# Patient Record
Sex: Female | Born: 1990 | Race: Black or African American | Hispanic: No | Marital: Single | Smoking: Never smoker
Health system: Southern US, Community
[De-identification: ages and names within clinical notes are randomized; demographics above are authoritative.]

---

## 2011-12-27 ENCOUNTER — Emergency Department (HOSPITAL_COMMUNITY)
Admission: EM | Admit: 2011-12-27 | Discharge: 2011-12-27 | Disposition: A | Discharge status: Home / Self Care | Payer: Self-pay | Attending: Emergency Medicine | Admitting: Emergency Medicine

## 2011-12-27 ENCOUNTER — Emergency Department (HOSPITAL_COMMUNITY): Payer: Self-pay

## 2011-12-27 ENCOUNTER — Encounter (HOSPITAL_COMMUNITY): Payer: Self-pay | Admitting: *Deleted

## 2011-12-27 DIAGNOSIS — N926 Irregular menstruation, unspecified: Secondary | ICD-10-CM | POA: Insufficient documentation

## 2011-12-27 DIAGNOSIS — R10819 Abdominal tenderness, unspecified site: Secondary | ICD-10-CM | POA: Insufficient documentation

## 2011-12-27 DIAGNOSIS — R102 Pelvic and perineal pain: Secondary | ICD-10-CM

## 2011-12-27 DIAGNOSIS — N939 Abnormal uterine and vaginal bleeding, unspecified: Secondary | ICD-10-CM | POA: Insufficient documentation

## 2011-12-27 DIAGNOSIS — R109 Unspecified abdominal pain: Secondary | ICD-10-CM | POA: Insufficient documentation

## 2011-12-27 LAB — CBC
HCT: 38.5 % (ref 36.0–46.0)
MCH: 24.9 pg — ABNORMAL LOW (ref 26.0–34.0)
MCV: 79.2 fL (ref 78.0–100.0)
Platelets: 270 10*3/uL (ref 150–400)
RBC: 4.86 MIL/uL (ref 3.87–5.11)

## 2011-12-27 LAB — POCT I-STAT, CHEM 8
BUN: 7 mg/dL (ref 6–23)
Calcium, Ion: 1.25 mmol/L (ref 1.12–1.32)
Chloride: 105 mEq/L (ref 96–112)
Creatinine, Ser: 0.6 mg/dL (ref 0.50–1.10)
Glucose, Bld: 89 mg/dL (ref 70–99)
HCT: 41 % (ref 36.0–46.0)
Hemoglobin: 13.9 g/dL (ref 12.0–15.0)
Potassium: 4.1 meq/L (ref 3.5–5.1)
Sodium: 142 meq/L (ref 135–145)
TCO2: 25 mmol/L (ref 0–100)

## 2011-12-27 LAB — URINALYSIS, ROUTINE W REFLEX MICROSCOPIC
Bilirubin Urine: NEGATIVE
Glucose, UA: NEGATIVE mg/dL
Ketones, ur: NEGATIVE mg/dL
Leukocytes, UA: NEGATIVE
Nitrite: NEGATIVE
Protein, ur: NEGATIVE mg/dL
Specific Gravity, Urine: 1.032 — ABNORMAL HIGH (ref 1.005–1.030)
Urobilinogen, UA: 1 mg/dL (ref 0.0–1.0)
pH: 5 (ref 5.0–8.0)

## 2011-12-27 LAB — WET PREP, GENITAL
Trich, Wet Prep: NONE SEEN
Yeast Wet Prep HPF POC: NONE SEEN

## 2011-12-27 LAB — URINE MICROSCOPIC-ADD ON

## 2011-12-27 LAB — POCT PREGNANCY, URINE: Preg Test, Ur: NEGATIVE

## 2011-12-27 MED ORDER — METRONIDAZOLE 500 MG PO TABS: 500.0000 mg | ORAL_TABLET | Freq: Two times a day (BID) | ORAL | Status: AC

## 2011-12-27 MED ORDER — IBUPROFEN 800 MG PO TABS: 800.0000 mg | ORAL_TABLET | Freq: Three times a day (TID) | ORAL | Status: AC | PRN

## 2011-12-27 NOTE — ED Provider Notes (Signed)
History     CSN: [id number]  Arrival date & time 12/27/11  1252   First MD Initiated Contact with Patient 12/27/11 1603      Chief Complaint  Patient presents with  . Abdominal Pain    (Consider location/radiation/quality/duration/timing/severity/associated sxs/prior treatment) Patient is a 21 y.o. female presenting with abdominal pain. The history is provided by the patient.  Abdominal Pain The primary symptoms of the illness include abdominal pain and nausea. The primary symptoms of the illness do not include fever, shortness of breath, vomiting, diarrhea or dysuria. The current episode started more than 2 days ago (around a week ago ). The onset of the illness was gradual ("comes and goes"). The problem has not changed since onset. The abdominal pain is located in the suprapubic region. The abdominal pain does not radiate. The severity of the abdominal pain is 7/10. The abdominal pain is relieved by nothing. Exacerbated by: none.  The patient states that she believes she is currently not pregnant (LMP ended 3/21, yesterday began menstration again ). The patient has not had a change in bowel habit. Risk factors: Pt is sexually active, butdoes not always use protection) OBGYN seen in august with no abnormal findings  Symptoms associated with the illness do not include chills, anorexia, diaphoresis, heartburn, constipation, urgency, hematuria, frequency or back pain.    History reviewed. No pertinent past medical history.  History reviewed. No pertinent past surgical history.  No family history on file.  History  Substance Use Topics  . Smoking status: Never Smoker   . Smokeless tobacco: Not on file  . Alcohol Use: No    OB History    Grav Para Term Preterm Abortions TAB SAB Ect Mult Living                  Review of Systems  Constitutional: Negative for fever, chills, diaphoresis and appetite change.  HENT: Negative for neck stiffness and dental problem.   Eyes: Negative  for visual disturbance.  Respiratory: Negative for cough, chest tightness, shortness of breath and wheezing.   Cardiovascular: Negative for chest pain.  Gastrointestinal: Positive for nausea and abdominal pain. Negative for heartburn, vomiting, diarrhea, constipation, blood in stool, abdominal distention, anal bleeding, rectal pain and anorexia.  Genitourinary: Negative for dysuria, urgency, frequency, hematuria and flank pain.  Musculoskeletal: Negative for myalgias, back pain and arthralgias.  Skin: Negative for rash.  Neurological: Negative for dizziness, syncope, speech difficulty, numbness and headaches.  Hematological: Does not bruise/bleed easily.  All other systems reviewed and are negative.    Allergies  Review of patient's allergies indicates no known allergies.  Home Medications  No current outpatient prescriptions on file.  BP 102/71  Pulse 78  Temp(Src) 98.4 F (36.9 C) (Oral)  Resp 16  SpO2 100%  LMP 12/27/2011  Physical Exam  Constitutional: She is oriented to person, place, and time. She appears well-developed and well-nourished. No distress.  HENT:  Head: Normocephalic and atraumatic.  Mouth/Throat: Oropharynx is clear and moist. No oropharyngeal exudate.  Eyes: Conjunctivae and EOM are normal. Pupils are equal, round, and reactive to light. No scleral icterus.  Neck: Normal range of motion. Neck supple. No tracheal deviation present. No thyromegaly present.  Cardiovascular: Normal rate, regular rhythm, normal heart sounds and intact distal pulses.   Pulmonary/Chest: Effort normal and breath sounds normal. No stridor. No respiratory distress. She has no wheezes.  Abdominal: Soft. There is tenderness in the suprapubic area and left lower quadrant. There is no CVA  tenderness.    Musculoskeletal: Normal range of motion. She exhibits no edema and no tenderness.  Neurological: She is alert and oriented to person, place, and time. Coordination normal.  Skin: Skin  is warm and dry. No rash noted. She is not diaphoretic. No erythema. No pallor.  Psychiatric: She has a normal mood and affect. Her behavior is normal.    ED Course  Procedures (including critical care time)  Labs Reviewed  URINALYSIS, ROUTINE W REFLEX MICROSCOPIC - Abnormal; Notable for the following:    Specific Gravity, Urine 1.032 (*)    Hgb urine dipstick TRACE (*)    All other components within normal limits  URINE MICROSCOPIC-ADD ON - Abnormal; Notable for the following:    Squamous Epithelial / LPF MANY (*)    All other components within normal limits  WET PREP, GENITAL - Abnormal; Notable for the following:    Clue Cells Wet Prep HPF POC RARE (*)    WBC, Wet Prep HPF POC RARE (*)    All other components within normal limits  CBC - Abnormal; Notable for the following:    MCH 24.9 (*)    All other components within normal limits  POCT PREGNANCY, URINE  POCT I-STAT, CHEM 8  GC/CHLAMYDIA PROBE AMP, GENITAL   US Transvaginal Non-ob  12/27/2011  *RADIOLOGY REPORT*  Clinical Data: Pelvic pain.  TRANSABDOMINAL AND TRANSVAGINAL ULTRASOUND OF PELVIS Technique:  Both transabdominal and transvaginal ultrasound examinations of the pelvis were performed. Transabdominal technique was performed for global imaging of the pelvis including uterus, ovaries, adnexal regions, and pelvic cul-de-sac.  Comparison: None.   It was necessary to proceed with endovaginal exam following the transabdominal exam to visualize the endometrium and ovaries.  Findings:  Uterus: Measures 8.5 x 3.4 x 4.9 cm.  No myometrial abnormalities are identified.  A small amount of fluid is noted in the endocervical canal.  Endometrium: Normal in thickness measuring a maximum of 5.5 mm.  Right ovary:  Measures 4.1 x 2.0 x 2.9 cm.  A small cyst is noted. This measures 2.3 x 1.2 x 1.0 cm.  Left ovary: Measures 3.1 x 2.0 x 3.9 cm.  No cysts or masses.  Other findings: Trace amount of free pelvic fluid.  Patent blood flow to both  ovaries.  IMPRESSION: Normal study. No evidence of pelvic mass or other significant abnormality.  Original Report Authenticated By: P. Loralie Champagne, M.D.   US Pelvis Complete  12/27/2011  *RADIOLOGY REPORT*  Clinical Data: Pelvic pain.  TRANSABDOMINAL AND TRANSVAGINAL ULTRASOUND OF PELVIS Technique:  Both transabdominal and transvaginal ultrasound examinations of the pelvis were performed. Transabdominal technique was performed for global imaging of the pelvis including uterus, ovaries, adnexal regions, and pelvic cul-de-sac.  Comparison: None.   It was necessary to proceed with endovaginal exam following the transabdominal exam to visualize the endometrium and ovaries.  Findings:  Uterus: Measures 8.5 x 3.4 x 4.9 cm.  No myometrial abnormalities are identified.  A small amount of fluid is noted in the endocervical canal.  Endometrium: Normal in thickness measuring a maximum of 5.5 mm.  Right ovary:  Measures 4.1 x 2.0 x 2.9 cm.  A small cyst is noted. This measures 2.3 x 1.2 x 1.0 cm.  Left ovary: Measures 3.1 x 2.0 x 3.9 cm.  No cysts or masses.  Other findings: Trace amount of free pelvic fluid.  Patent blood flow to both ovaries.  IMPRESSION: Normal study. No evidence of pelvic mass or other significant abnormality.  Original  Report Authenticated By: P. Loralie Champagne, M.D.     No diagnosis found.    MDM  Patient being treated for bacterial vaginosis due to findings of clue cells on wet prep.  Patient is advised to followup with OB/GYN, women's health clinic in regards to today's symptoms.  Patient is stable, pain-free, and in no acute distress prior to discharge.  Patient is agreeable with above plan.  Patient will be sent home with pain medications.        Jaci Carrel, [place] 12/27/11 1815

## 2011-12-27 NOTE — ED Notes (Signed)
Pt state she is having lower abdominal pain. Pt states she had a cycle 10 days ago and has started to have vaginal bleeding again. Pt denies any urinary symptoms

## 2011-12-27 NOTE — ED Notes (Signed)
Reviewed d/c instructions and Rx for Flagyl and Motrin.

## 2011-12-27 NOTE — Discharge Instructions (Signed)
Follow up with [facility] for further evaluation of you pain/abnormal menstural bleeding.  Use Motrin as prescribed for pain and be sure to take it with a full meal.   .SEEK IMMEDIATE MEDICAL CARE IF:  The pain does not go away.  You have a fever >101 that persists You keep throwing up (vomiting) or cannot drink liquids.  The pain becomes localized (Pain in the right side could possibly be appendicitis. In an adult, pain in the left lower portion of the abdomen could be colitis or diverticulitis). You pass bloody or black tarry stools.  You have shaking chills.  There is blood in your vomit or you see blood in your bowel movements.  Your bowel movements stop (become blocked) or you cannot pass gas.  You have bloody, frequent, or painful urination.  You have yellow discoloration in the skin or whites of the eyes.  Your stomach becomes bloated or bigger.  You have dizziness or fainting.  You have chest or back pain.

## 2011-12-29 LAB — GC/CHLAMYDIA PROBE AMP, GENITAL: GC Probe Amp, Genital: NEGATIVE

## 2011-12-30 NOTE — ED Provider Notes (Signed)
Medical screening examination/treatment/procedure(s) were performed by non-physician practitioner and as supervising physician I was immediately available for consultation/collaboration.   Gavin Pound. Oletta Lamas, MD 12/30/11 2028

## 2013-05-09 ENCOUNTER — Emergency Department (HOSPITAL_COMMUNITY)
Admission: EM | Admit: 2013-05-09 | Discharge: 2013-05-09 | Disposition: A | Discharge status: Home / Self Care | Payer: Self-pay | Attending: Emergency Medicine | Admitting: Emergency Medicine

## 2013-05-09 ENCOUNTER — Encounter (HOSPITAL_COMMUNITY): Payer: Self-pay | Admitting: Emergency Medicine

## 2013-05-09 DIAGNOSIS — R51 Headache: Secondary | ICD-10-CM | POA: Insufficient documentation

## 2013-05-09 DIAGNOSIS — H9190 Unspecified hearing loss, unspecified ear: Secondary | ICD-10-CM | POA: Insufficient documentation

## 2013-05-09 DIAGNOSIS — J029 Acute pharyngitis, unspecified: Secondary | ICD-10-CM | POA: Insufficient documentation

## 2013-05-09 DIAGNOSIS — H9210 Otorrhea, unspecified ear: Secondary | ICD-10-CM | POA: Insufficient documentation

## 2013-05-09 DIAGNOSIS — J3489 Other specified disorders of nose and nasal sinuses: Secondary | ICD-10-CM | POA: Insufficient documentation

## 2013-05-09 DIAGNOSIS — H6120 Impacted cerumen, unspecified ear: Secondary | ICD-10-CM | POA: Insufficient documentation

## 2013-05-09 DIAGNOSIS — H6122 Impacted cerumen, left ear: Secondary | ICD-10-CM

## 2013-05-09 MED ORDER — CARBAMIDE PEROXIDE 6.5 % OT SOLN
5.0000 [drp] | Freq: Once | OTIC | Status: DC
  Filled 2013-05-09: qty 15

## 2013-05-09 NOTE — ED Notes (Signed)
Per pt, states left ear clogged since last Monday-

## 2013-05-09 NOTE — ED Provider Notes (Signed)
CSN: [id number]     Arrival date & time 05/09/13  1753 History    This chart was scribed for a non-physician practitioner, Antony Madura, PA-C, working with Ward Givens, MD by Frederik Pear, ED Scribe. This patient was seen in room WTR8/WTR8 and the patient's care was started at 1855.   First MD Initiated Contact with Patient 05/09/13 1855     Chief Complaint  Patient presents with  . Otalgia   (Consider location/radiation/quality/duration/timing/severity/associated sxs/prior Treatment) Patient is a 22 y.o. female presenting with ear pain. The history is provided by the patient. No language interpreter was used.  Otalgia Associated symptoms: ear discharge and hearing loss    HPI Comments: Nancy Cruz is a 22 y.o. female who presents to the Emergency Department complaining of worsening, constant, throbbing, non-radiating left otalgia with a scratchy sore throat, decreased hearing, and white otorrhea that began a week and a half ago without trauma or injury. She also complains of a HA that began this morning. She reports sinus congestion over the past week, but the symptom has since resolved after taking OTC sinus medication. She denies drooling, oral bleeding, fever, or inability to swallow. She treated the pain at home with ibuprofen, which provided temporary relief.   Dr. Allena Katz is PCP.   History reviewed. No pertinent past medical history. History reviewed. No pertinent past surgical history. No family history on file. History  Substance Use Topics  . Smoking status: Never Smoker   . Smokeless tobacco: Not on file  . Alcohol Use: No   OB History   Grav Para Term Preterm Abortions TAB SAB Ect Mult Living                 Review of Systems  HENT: Positive for hearing loss, ear pain, sinus pressure and ear discharge.   All other systems reviewed and are negative.   Allergies  Review of patient's allergies indicates no known allergies.  Home Medications   Current Outpatient  Rx  Name  Route  Sig  Dispense  Refill  . diphenhydrAMINE (BENADRYL) 25 MG tablet   Oral   Take 25 mg by mouth every 6 (six) hours as needed for itching.         Marland Kitchen ibuprofen (ADVIL,MOTRIN) 200 MG tablet   Oral   Take 200 mg by mouth every 6 (six) hours as needed for pain.         . Pseudoeph-Doxylamine-DM-APAP (NYQUIL MULTI-SYMPTOM PO)   Oral   Take 1 capsule by mouth at bedtime as needed.          BP 101/66  Pulse 73  Temp(Src) 99.2 F (37.3 C) (Oral)  Resp 18  SpO2 100%  LMP 04/24/2013 Physical Exam  Nursing note and vitals reviewed. Constitutional: She is oriented to person, place, and time. She appears well-developed and well-nourished. No distress.  HENT:  Head: Normocephalic and atraumatic.  Right Ear: Tympanic membrane normal.  Left Ear: Tympanic membrane normal.  Mouth/Throat: Uvula is midline and oropharynx is clear and moist. No oropharyngeal exudate, posterior oropharyngeal edema, posterior oropharyngeal erythema or tonsillar abscesses.   TMs are occluded with cerumen. She can hear to 10 inches on the left and 2.5 feet on right.  Post-cerumen impaction the pt's TMs are clear.  Eyes: EOM are normal.  Neck: Neck supple. No tracheal deviation present.  Cardiovascular: Normal rate.   Pulmonary/Chest: Effort normal. No respiratory distress.  Musculoskeletal: Normal range of motion.  Lymphadenopathy:    She has no  cervical adenopathy.  Neurological: She is alert and oriented to person, place, and time.  Skin: Skin is warm and dry.  Psychiatric: She has a normal mood and affect. Her behavior is normal.    ED Course   Procedures (including critical care time)  DIAGNOSTIC STUDIES: Oxygen Saturation is 100% on room air, normal by my interpretation.    COORDINATION OF CARE:  19:15- Discussed planned course of treatment with the patient, including Debrox and a cerumen removal, who is agreeable at this time.  20:26- Post cerumen impaction, the pt's left TM  is clear after removing a nugget of cerumen.  Labs Reviewed - No data to display No results found.  1. Cerumen impaction, left    MDM  Uncomplicated cerumen impaction - Cerumen removed with warm water flush in ED without any immediate complications. Hearing restored and equal b/l after disimpaction. Patient appropriate for d/c with PCP follow up as needed. Indications for return discussed and patient agreeable to d/c plan.  I personally performed the services described in this documentation, which was scribed in my presence. The recorded information has been reviewed and is accurate.     Antony Madura, PA-C 05/17/13 1417

## 2013-05-18 NOTE — ED Provider Notes (Signed)
Medical screening examination/treatment/procedure(s) were performed by non-physician practitioner and as supervising physician I was immediately available for consultation/collaboration. Wisdom Seybold, MD, FACEP   Wisdom Rickey L Afifa Truax, MD 05/18/13 0708 

## 2018-06-12 ENCOUNTER — Encounter (HOSPITAL_BASED_OUTPATIENT_CLINIC_OR_DEPARTMENT_OTHER): Payer: Self-pay

## 2018-06-12 ENCOUNTER — Emergency Department (HOSPITAL_BASED_OUTPATIENT_CLINIC_OR_DEPARTMENT_OTHER)
Admission: EM | Admit: 2018-06-12 | Discharge: 2018-06-12 | Disposition: A | Discharge status: Home / Self Care | Payer: 59 | Attending: Emergency Medicine | Admitting: Emergency Medicine

## 2018-06-12 ENCOUNTER — Emergency Department (HOSPITAL_BASED_OUTPATIENT_CLINIC_OR_DEPARTMENT_OTHER): Payer: 59

## 2018-06-12 DIAGNOSIS — Y92410 Unspecified street and highway as the place of occurrence of the external cause: Secondary | ICD-10-CM | POA: Diagnosis not present

## 2018-06-12 DIAGNOSIS — S161XXA Strain of muscle, fascia and tendon at neck level, initial encounter: Secondary | ICD-10-CM | POA: Insufficient documentation

## 2018-06-12 DIAGNOSIS — S199XXA Unspecified injury of neck, initial encounter: Secondary | ICD-10-CM | POA: Diagnosis present

## 2018-06-12 DIAGNOSIS — Y9389 Activity, other specified: Secondary | ICD-10-CM | POA: Diagnosis not present

## 2018-06-12 DIAGNOSIS — Y999 Unspecified external cause status: Secondary | ICD-10-CM | POA: Insufficient documentation

## 2018-06-12 DIAGNOSIS — M542 Cervicalgia: Secondary | ICD-10-CM | POA: Diagnosis not present

## 2018-06-12 LAB — PREGNANCY, URINE: Preg Test, Ur: NEGATIVE

## 2018-06-12 MED ORDER — IBUPROFEN 800 MG PO TABS: 800.0000 mg | ORAL_TABLET | Freq: Three times a day (TID) | ORAL | 0 refills | Status: DC | PRN

## 2018-06-12 NOTE — Discharge Instructions (Signed)
Ice and heat to the areas that are sore.  She can expect to be more sore over the next 7 to 10 days.  Follow-up with a primary doctor.
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
## 2019-07-06 ENCOUNTER — Emergency Department (HOSPITAL_BASED_OUTPATIENT_CLINIC_OR_DEPARTMENT_OTHER)
Admission: EM | Admit: 2019-07-06 | Discharge: 2019-07-06 | Disposition: A | Discharge status: Home / Self Care | Payer: Managed Care, Other (non HMO) | Attending: Emergency Medicine | Admitting: Emergency Medicine

## 2019-07-06 ENCOUNTER — Encounter (HOSPITAL_BASED_OUTPATIENT_CLINIC_OR_DEPARTMENT_OTHER): Payer: Self-pay | Admitting: Emergency Medicine

## 2019-07-06 ENCOUNTER — Other Ambulatory Visit: Payer: Self-pay

## 2019-07-06 ENCOUNTER — Emergency Department (HOSPITAL_BASED_OUTPATIENT_CLINIC_OR_DEPARTMENT_OTHER): Payer: Managed Care, Other (non HMO)

## 2019-07-06 DIAGNOSIS — J069 Acute upper respiratory infection, unspecified: Secondary | ICD-10-CM | POA: Diagnosis not present

## 2019-07-06 DIAGNOSIS — R509 Fever, unspecified: Secondary | ICD-10-CM | POA: Diagnosis present

## 2019-07-06 DIAGNOSIS — U071 COVID-19: Secondary | ICD-10-CM | POA: Insufficient documentation

## 2019-07-06 LAB — CBC
HCT: 40.2 % (ref 36.0–46.0)
Hemoglobin: 12 g/dL (ref 12.0–15.0)
MCH: 24.1 pg — ABNORMAL LOW (ref 26.0–34.0)
MCHC: 29.9 g/dL — ABNORMAL LOW (ref 30.0–36.0)
MCV: 80.9 fL (ref 80.0–100.0)
Platelets: 248 10*3/uL (ref 150–400)
RBC: 4.97 MIL/uL (ref 3.87–5.11)
RDW: 13.3 % (ref 11.5–15.5)
WBC: 3.1 10*3/uL — ABNORMAL LOW (ref 4.0–10.5)
nRBC: 0 % (ref 0.0–0.2)

## 2019-07-06 LAB — TROPONIN I (HIGH SENSITIVITY)
Troponin I (High Sensitivity): <2 ng/L (ref <18)
Troponin I (High Sensitivity): <2 ng/L (ref <18)

## 2019-07-06 LAB — BASIC METABOLIC PANEL
Anion gap: 10 (ref 5–15)
BUN: 8 mg/dL (ref 6–20)
CO2: 26 mmol/L (ref 22–32)
Calcium: 8.8 mg/dL — ABNORMAL LOW (ref 8.9–10.3)
Chloride: 102 mmol/L (ref 98–111)
Creatinine, Ser: 0.71 mg/dL (ref 0.44–1.00)
GFR calc Af Amer: >60 mL/min (ref >60)
GFR calc non Af Amer: >60 mL/min (ref >60)
Glucose, Bld: 95 mg/dL (ref 70–99)
Potassium: 3.5 mmol/L (ref 3.5–5.1)
Sodium: 138 mmol/L (ref 135–145)

## 2019-07-06 LAB — PREGNANCY, URINE: Preg Test, Ur: NEGATIVE

## 2019-07-06 MED ORDER — ACETAMINOPHEN 325 MG PO TABS
650.0000 mg | ORAL_TABLET | Freq: Once | ORAL | Status: AC
  Administered 2019-07-06: 21:00:00 650 mg via ORAL
  Filled 2019-07-06: qty 2

## 2019-07-06 MED ORDER — SODIUM CHLORIDE 0.9% FLUSH
3.0000 mL | Freq: Once | INTRAVENOUS | Status: DC
  Filled 2019-07-06: qty 3

## 2019-07-06 NOTE — ED Notes (Signed)
Placed on cardiac monitor. Pt states she feels better, only complaining of headache now. IV and blood work initiated in triage.

## 2019-07-06 NOTE — ED Notes (Addendum)
Second troponin draw not needed per EDP.

## 2019-07-06 NOTE — ED Provider Notes (Signed)
[facility] EMERGENCY DEPARTMENT Provider Note   CSN: [id number] Arrival date & time: 07/06/19  1744     History   Chief Complaint Chief Complaint  Patient presents with  . Chest Pain  . Shortness of Breath  . Palpitations  . Headache  . Fever    101.2    HPI Nancy Cruz is a 28 y.o. female.     The history is provided by the patient.  Fever Max temp prior to arrival:  101 Temp source:  Oral Severity:  Mild Onset quality:  Gradual Duration:  4 days Timing:  Intermittent Progression:  Waxing and waning Chronicity:  New Relieved by:  Acetaminophen Worsened by:  Nothing Associated symptoms: no chest pain, no chills, no confusion, no congestion, no cough, no diarrhea, no dysuria, no ear pain, no headaches, no myalgias, no nausea, no rash, no rhinorrhea, no somnolence, no sore throat and no vomiting   Risk factors: sick contacts     History reviewed. No pertinent past medical history.  There are no active problems to display for this patient.   History reviewed. No pertinent surgical history.   OB History   No obstetric history on file.      Home Medications    Prior to Admission medications   Not on File    Family History No family history on file.  Social History Social History   Tobacco Use  . Smoking status: Never Smoker  . Smokeless tobacco: Never Used  Substance Use Topics  . Alcohol use: No  . Drug use: No     Allergies   Patient has no known allergies.   Review of Systems Review of Systems  Constitutional: Positive for fever. Negative for chills.  HENT: Negative for congestion, ear pain, rhinorrhea and sore throat.   Eyes: Negative for pain and visual disturbance.  Respiratory: Negative for cough and shortness of breath.   Cardiovascular: Negative for chest pain and palpitations.  Gastrointestinal: Negative for abdominal pain, diarrhea, nausea and vomiting.  Genitourinary: Negative for dysuria and hematuria.   Musculoskeletal: Negative for arthralgias, back pain and myalgias.  Skin: Negative for color change and rash.  Neurological: Negative for seizures, syncope and headaches.  Psychiatric/Behavioral: Negative for confusion.  All other systems reviewed and are negative.    Physical Exam Updated Vital Signs BP 101/76   Pulse 72   Temp 100.3 F (37.9 C) (Oral)   Resp 20   Ht 5' 2.75" (1.594 m)   Wt 63.5 kg   LMP 06/17/2019   SpO2 100%   BMI 25.00 kg/m   Physical Exam Vitals signs and nursing note reviewed.  Constitutional:      General: She is not in acute distress.    Appearance: She is well-developed. She is not ill-appearing.  HENT:     Head: Normocephalic and atraumatic.  Eyes:     Extraocular Movements: Extraocular movements intact.     Conjunctiva/sclera: Conjunctivae normal.     Pupils: Pupils are equal, round, and reactive to light.  Neck:     Musculoskeletal: Neck supple.  Cardiovascular:     Rate and Rhythm: Normal rate and regular rhythm.     Pulses:          Radial pulses are 2+ on the right side and 2+ on the left side.     Heart sounds: Normal heart sounds. No murmur.  Pulmonary:     Effort: Pulmonary effort is normal. No tachypnea, accessory muscle usage or respiratory distress.  Breath sounds: Normal breath sounds. No decreased breath sounds, wheezing, rhonchi or rales.  Abdominal:     Palpations: Abdomen is soft.     Tenderness: There is no abdominal tenderness.  Musculoskeletal: Normal range of motion.     Right lower leg: No edema.     Left lower leg: No edema.  Skin:    General: Skin is warm and dry.     Capillary Refill: Capillary refill takes less than 2 seconds.  Neurological:     General: No focal deficit present.     Mental Status: She is alert and oriented to person, place, and time.     Cranial Nerves: No cranial nerve deficit.     Motor: No weakness.  Psychiatric:        Mood and Affect: Mood normal.      ED Treatments / Results   Labs (all labs ordered are listed, but only abnormal results are displayed) Labs Reviewed  BASIC METABOLIC PANEL - Abnormal; Notable for the following components:      Result Value   Calcium 8.8 (*)    All other components within normal limits  CBC - Abnormal; Notable for the following components:   WBC 3.1 (*)    MCH 24.1 (*)    MCHC 29.9 (*)    All other components within normal limits  SARS CORONAVIRUS 2 (TAT 6-24 HRS)  PREGNANCY, URINE  TROPONIN I (HIGH SENSITIVITY)  TROPONIN I (HIGH SENSITIVITY)    EKG EKG Interpretation  Date/Time:  Saturday July 06 2019 18:01:37 EDT Ventricular Rate:  98 PR Interval:  164 QRS Duration: 76 QT Interval:  346 QTC Calculation: 441 R Axis:   82 Text Interpretation:  Normal sinus rhythm Nonspecific T wave abnormality Abnormal ECG Confirmed by Lennice Sites [phone]) on 07/06/2019 8:33:16 PM   Radiology Dg Chest 2 View  Result Date: 07/06/2019 CLINICAL DATA:  Congestion, fever 101, body aches x 4 days. Non smoker. EXAM: CHEST - 2 VIEW COMPARISON:  None. FINDINGS: The heart size and mediastinal contours are within normal limits. The lungs are clear. No pneumothorax or pleural effusion. The visualized skeletal structures are unremarkable. IMPRESSION: No evidence of active disease in the chest. Electronically Signed   By: Audie Pinto M.D.   On: 07/06/2019 18:21    Procedures Procedures (including critical care time)  Medications Ordered in ED Medications  sodium chloride flush (NS) 0.9 % injection 3 mL (3 mLs Intravenous Not Given 07/06/19 1956)  acetaminophen (TYLENOL) tablet 650 mg (650 mg Oral Given 07/06/19 2038)     Initial Impression / Assessment and Plan / ED Course  I have reviewed the triage vital signs and the nursing notes.  Pertinent labs & imaging results that were available during my care of the patient were reviewed by me and considered in my medical decision making (see chart for details).     Nancy Cruz is a 28 year old female with no significant medical problems who presents to the ED with fever, cough.  Patient with low-grade fever upon arrival but otherwise normal vitals.  Patient works in the healthcare setting with patients with coronavirus.  Has body aches, fever, congestion for the last 4 days.  No signs to suggest meningitis.  Clear breath sounds on exam.  No signs of respiratory distress.  X-ray of the chest showed no signs of pneumonia, no pneumothorax, no pleural effusion.  Patient has a white count of 3.1.  She had 2 troponins drawn that were normal.  Doubt cardiac  process.  EKG shows sinus rhythm.  No suspicion for PE.  No other significant anemia, electrolyte abnormality.  Patient does not have any urinary symptoms.  Overall suspect the patient has coronavirus or other viral process.  Coronavirus was sent for.  Given instructions for self-isolation at home.  Told return to the ED symptoms worsen.  Discharged in good condition.  This chart was dictated using voice recognition software.  Despite best efforts to proofread,  errors can occur which can change the documentation meaning.    Nancy Cruz was evaluated in Emergency Department on 07/06/2019 for the symptoms described in the history of present illness. She was evaluated in the context of the global COVID-19 pandemic, which necessitated consideration that the patient might be at risk for infection with the SARS-CoV-2 virus that causes COVID-19. Institutional protocols and algorithms that pertain to the evaluation of patients at risk for COVID-19 are in a state of rapid change based on information released by regulatory bodies including the CDC and federal and state organizations. These policies and algorithms were followed during the patient's care in the ED.   Final Clinical Impressions(s) / ED Diagnoses   Final diagnoses:  Upper respiratory tract infection, unspecified type    ED Discharge Orders    None        Virgina Norfolk, DO 07/06/19 2236

## 2019-07-06 NOTE — ED Triage Notes (Signed)
Pt c/o left sided chest pain when she coughs, but has intermittent chest pain in general. Pt reports fever, Nausea, shortness of breath, heart palpitations, and headache x 1 week

## 2019-07-07 ENCOUNTER — Telehealth (HOSPITAL_BASED_OUTPATIENT_CLINIC_OR_DEPARTMENT_OTHER): Payer: Self-pay | Admitting: *Deleted

## 2019-07-07 LAB — SARS CORONAVIRUS 2 (TAT 6-24 HRS): SARS Coronavirus 2: POSITIVE — AB
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
## 2020-12-31 ENCOUNTER — Other Ambulatory Visit: Payer: Self-pay

## 2020-12-31 ENCOUNTER — Emergency Department (HOSPITAL_BASED_OUTPATIENT_CLINIC_OR_DEPARTMENT_OTHER)
Admission: EM | Admit: 2020-12-31 | Discharge: 2020-12-31 | Disposition: A | Discharge status: Home / Self Care | Payer: No Typology Code available for payment source | Attending: Emergency Medicine | Admitting: Emergency Medicine

## 2020-12-31 ENCOUNTER — Encounter (HOSPITAL_BASED_OUTPATIENT_CLINIC_OR_DEPARTMENT_OTHER): Payer: Self-pay

## 2020-12-31 DIAGNOSIS — E871 Hypo-osmolality and hyponatremia: Secondary | ICD-10-CM | POA: Insufficient documentation

## 2020-12-31 DIAGNOSIS — R110 Nausea: Secondary | ICD-10-CM | POA: Insufficient documentation

## 2020-12-31 DIAGNOSIS — R000 Tachycardia, unspecified: Secondary | ICD-10-CM | POA: Diagnosis not present

## 2020-12-31 DIAGNOSIS — Z20822 Contact with and (suspected) exposure to covid-19: Secondary | ICD-10-CM | POA: Insufficient documentation

## 2020-12-31 DIAGNOSIS — R519 Headache, unspecified: Secondary | ICD-10-CM | POA: Insufficient documentation

## 2020-12-31 DIAGNOSIS — M542 Cervicalgia: Secondary | ICD-10-CM | POA: Diagnosis not present

## 2020-12-31 DIAGNOSIS — R070 Pain in throat: Secondary | ICD-10-CM | POA: Diagnosis present

## 2020-12-31 DIAGNOSIS — J029 Acute pharyngitis, unspecified: Secondary | ICD-10-CM | POA: Insufficient documentation

## 2020-12-31 DIAGNOSIS — E876 Hypokalemia: Secondary | ICD-10-CM | POA: Diagnosis not present

## 2020-12-31 LAB — COMPREHENSIVE METABOLIC PANEL
ALT: 29 U/L (ref 0–44)
AST: 26 U/L (ref 15–41)
Albumin: 3.9 g/dL (ref 3.5–5.0)
Alkaline Phosphatase: 58 U/L (ref 38–126)
Anion gap: 11 (ref 5–15)
BUN: 8 mg/dL (ref 6–20)
CO2: 21 mmol/L — ABNORMAL LOW (ref 22–32)
Calcium: 8.6 mg/dL — ABNORMAL LOW (ref 8.9–10.3)
Chloride: 97 mmol/L — ABNORMAL LOW (ref 98–111)
Creatinine, Ser: 0.64 mg/dL (ref 0.44–1.00)
GFR, Estimated: >60 mL/min (ref >60)
Glucose, Bld: 97 mg/dL (ref 70–99)
Potassium: 3 mmol/L — ABNORMAL LOW (ref 3.5–5.1)
Sodium: 129 mmol/L — ABNORMAL LOW (ref 135–145)
Total Bilirubin: 1.8 mg/dL — ABNORMAL HIGH (ref 0.3–1.2)
Total Protein: 7.4 g/dL (ref 6.5–8.1)

## 2020-12-31 LAB — CBC WITH DIFFERENTIAL/PLATELET
Abs Immature Granulocytes: 0.02 10*3/uL (ref 0.00–0.07)
Basophils Absolute: 0 10*3/uL (ref 0.0–0.1)
Basophils Relative: 0 %
Eosinophils Absolute: 0 10*3/uL (ref 0.0–0.5)
Eosinophils Relative: 0 %
HCT: 37 % (ref 36.0–46.0)
Hemoglobin: 12 g/dL (ref 12.0–15.0)
Immature Granulocytes: 0 %
Lymphocytes Relative: 17 %
Lymphs Abs: 1.4 10*3/uL (ref 0.7–4.0)
MCH: 25.3 pg — ABNORMAL LOW (ref 26.0–34.0)
MCHC: 32.4 g/dL (ref 30.0–36.0)
MCV: 77.9 fL — ABNORMAL LOW (ref 80.0–100.0)
Monocytes Absolute: 1 10*3/uL (ref 0.1–1.0)
Monocytes Relative: 13 %
Neutro Abs: 5.5 10*3/uL (ref 1.7–7.7)
Neutrophils Relative %: 70 %
Platelets: 222 10*3/uL (ref 150–400)
RBC: 4.75 MIL/uL (ref 3.87–5.11)
RDW: 13.5 % (ref 11.5–15.5)
WBC: 8 10*3/uL (ref 4.0–10.5)
nRBC: 0 % (ref 0.0–0.2)

## 2020-12-31 LAB — GROUP A STREP BY PCR: Group A Strep by PCR: NOT DETECTED

## 2020-12-31 LAB — RESP PANEL BY RT-PCR (FLU A&B, COVID) ARPGX2
Influenza A by PCR: NEGATIVE
Influenza B by PCR: NEGATIVE
SARS Coronavirus 2 by RT PCR: NEGATIVE

## 2020-12-31 LAB — PREGNANCY, URINE: Preg Test, Ur: NEGATIVE

## 2020-12-31 LAB — MONONUCLEOSIS SCREEN: Mono Screen: NEGATIVE

## 2020-12-31 MED ORDER — LIDOCAINE VISCOUS HCL 2 % MT SOLN: 15.0000 mL | OROMUCOSAL | 0 refills | Status: AC | PRN

## 2020-12-31 MED ORDER — IBUPROFEN 800 MG PO TABS
800.0000 mg | ORAL_TABLET | Freq: Once | ORAL | Status: AC
  Administered 2020-12-31: 800 mg via ORAL
  Filled 2020-12-31: qty 1

## 2020-12-31 MED ORDER — AMOXICILLIN 500 MG PO CAPS: 500.0000 mg | ORAL_CAPSULE | Freq: Two times a day (BID) | ORAL | 0 refills | Status: AC

## 2020-12-31 MED ORDER — AMOXICILLIN 500 MG PO CAPS
500.0000 mg | ORAL_CAPSULE | Freq: Once | ORAL | Status: AC
  Administered 2020-12-31: 500 mg via ORAL
  Filled 2020-12-31: qty 1

## 2020-12-31 MED ORDER — POTASSIUM CHLORIDE CRYS ER 20 MEQ PO TBCR: 20.0000 meq | EXTENDED_RELEASE_TABLET | Freq: Two times a day (BID) | ORAL | 0 refills | Status: AC

## 2020-12-31 MED ORDER — DEXAMETHASONE SODIUM PHOSPHATE 10 MG/ML IJ SOLN
10.0000 mg | Freq: Once | INTRAMUSCULAR | Status: AC
  Administered 2020-12-31: 10 mg via INTRAMUSCULAR
  Filled 2020-12-31: qty 1

## 2020-12-31 NOTE — ED Provider Notes (Signed)
MSE was initiated and I personally evaluated the patient and placed orders (if any) at  4:38 PM on December 31, 2020.  The patient appears stable so that the remainder of the MSE may be completed by another provider.   Patient placed in Quick Look pathway, seen and evaluated   Chief Complaint: fever, sore throat, nausea  HPI:   30 y.o. F who presents for evaluation of 2 days of subjective fever, chills as well as sore throat, left-sided neck pain, body aches.  She has been able to tolerate secretions in her saliva but does report worsening pain with doing so.  She has had nausea but no vomiting.  ROS: Nausea, fever, sore throat (one)  Physical Exam:   Gen: No distress  Neuro: Awake and Alert  Skin: Warm    Focused Exam: Posterior oropharynx is erythematous, with exudates noted bilaterally.  Airways pain, phonation is intact.   Initiation of care has begun. The patient has been counseled on the process, plan, and necessity for staying for the completion/evaluation, and the remainder of the medical screening examination    Rosana Hoes 12/31/20 1639    Koleen Distance, MD 12/31/20 [phone]

## 2020-12-31 NOTE — ED Triage Notes (Signed)
Pt c/o sore throat, fever, chills, left side neck pain, nausea-sx started 4/3-NAD/tearful-steady gait-last tylenol 7am

## 2020-12-31 NOTE — ED Notes (Signed)
Pt ambulated to the bathroom steady gait

## 2020-12-31 NOTE — Discharge Instructions (Addendum)
You are seen in the ER today for your sore throat.  Your physical exam revealed pus in your tonsils, concerning for possible bacterial infection.  Your strep test, mono test, flu, and Covid tests were all negative.  You have been started on amoxicillin which is an antibiotic which should take twice a day for the next 10 days to treat a bacterial infection.  Please take the entire antibiotic as prescribed for the entire course.  Additionally you have been prescribed a medication called viscous lidocaine which you may gargle 3 times a day as needed for your sore throat.  You administered a dose of steroid in the emergency department to help with the inflammation in your throat.   Additionally your blood work revealed that you are dehydrated and that your potassium was low.  Please drink plenty of water at home over the next few days, you may supplement with Gatorade other electrolyte drinks as needed.  For your low potassium, you have been prescribed potassium supplement to take for next 3 days.  Return to the ER if you develop any difficulty swallowing your saliva, tightness in your throat, difficulty breathing, nausea or vomiting that does not stop, fevers after 48 hours on antibiotics, or any other severe symptoms.

## 2020-12-31 NOTE — ED Provider Notes (Signed)
[facility] EMERGENCY DEPARTMENT Provider Note   CSN: [id number] Arrival date & time: 12/31/20  1417     History Chief Complaint  Patient presents with  . Sore Throat    Nancy Cruz is a 30 y.o. female who presents with concern for 2 days of subjective fever, chills, sore throat and headache.  She states that approximately 4 days ago she woke up with some left-sided muscular neck pain from the way that she slept, however feels this is unrelated to her infectious symptoms.  She denies any known sick contacts, denies any difficulty swallowing her own secretions but does endorse worsening throat pain when she swallows anything but water.  She denies any  vomiting, abdominal pain, diarrhea, but does endorse some mild nausea.  She denies any change in her voice or neck swelling.  MSE was performed in triage and basic laboratory studies were ordered.  I personally reviewed this patient's medical records.  She denied any medical diagnoses and is not on any medications every day.  HPI     History reviewed. No pertinent past medical history.  There are no problems to display for this patient.   History reviewed. No pertinent surgical history.   OB History   No obstetric history on file.     No family history on file.  Social History   Tobacco Use  . Smoking status: Never Smoker  . Smokeless tobacco: Never Used  Vaping Use  . Vaping Use: Never used  Substance Use Topics  . Alcohol use: Yes    Comment: occ  . Drug use: No    Home Medications Prior to Admission medications   Medication Sig Start Date End Date Taking? Authorizing Provider  amoxicillin (AMOXIL) 500 MG capsule Take 1 capsule (500 mg total) by mouth 2 (two) times daily for 10 days. 12/31/20 01/10/21 Yes Kordelia Severin R, PA-C  lidocaine (XYLOCAINE) 2 % solution Use as directed 15 mLs in the mouth or throat as needed for mouth pain. 12/31/20  Yes Graiden Henes R, PA-C  potassium chloride SA  (KLOR-CON) 20 MEQ tablet Take 1 tablet (20 mEq total) by mouth 2 (two) times daily for 3 days. 12/31/20 01/03/21 Yes Joelys Staubs, Eugene Gavia, PA-C    Allergies    Patient has no known allergies.  Review of Systems   Review of Systems  Constitutional: Positive for appetite change, chills, diaphoresis, fatigue and fever.  HENT: Positive for sore throat. Negative for congestion, dental problem, drooling, ear discharge, trouble swallowing and voice change.   Respiratory: Negative.   Cardiovascular: Negative.   Gastrointestinal: Negative.   Musculoskeletal: Positive for myalgias and neck pain. Negative for arthralgias, back pain, gait problem, joint swelling and neck stiffness.       Left sided muscular neck pain preceding infectious symptoms.   Skin: Negative.   Neurological: Positive for headaches. Negative for dizziness, tremors, facial asymmetry, weakness and light-headedness.    Physical Exam Updated Vital Signs BP 97/61   Pulse 84   Temp 98.8 F (37.1 C) (Oral)   Resp 16   Ht 5\' 6"  (1.676 m)   Wt 67.6 kg   LMP 12/18/2020   SpO2 100%   BMI 24.05 kg/m   Physical Exam Vitals and nursing note reviewed.  Constitutional:      Appearance: She is ill-appearing. She is not toxic-appearing.  HENT:     Head: Normocephalic and atraumatic.     Nose: Nose normal.     Mouth/Throat:     Mouth:  Mucous membranes are moist.     Tongue: No lesions.     Palate: No mass and lesions.     Pharynx: Uvula midline. Posterior oropharyngeal erythema present. No pharyngeal swelling, oropharyngeal exudate or uvula swelling.     Tonsils: Tonsillar exudate present. No tonsillar abscesses. 2+ on the right. 2+ on the left.  Eyes:     General: Lids are normal. Vision grossly intact.        Right eye: No discharge.        Left eye: No discharge.     Extraocular Movements: Extraocular movements intact.     Conjunctiva/sclera: Conjunctivae normal.     Pupils: Pupils are equal, round, and reactive to light.   Neck:     Trachea: Trachea and phonation normal.     Meningeal: Brudzinski's sign and Kernig's sign absent.  Cardiovascular:     Rate and Rhythm: Normal rate and regular rhythm.     Pulses: Normal pulses.     Heart sounds: Normal heart sounds. No murmur heard.   Pulmonary:     Effort: Pulmonary effort is normal. No tachypnea, bradypnea, accessory muscle usage, prolonged expiration or respiratory distress.     Breath sounds: Normal breath sounds. No wheezing or rales.  Chest:     Chest wall: No mass, lacerations, deformity, swelling, tenderness, crepitus or edema.  Abdominal:     General: Bowel sounds are normal. There is no distension.     Palpations: Abdomen is soft.     Tenderness: There is no abdominal tenderness. There is no right CVA tenderness, left CVA tenderness, guarding or rebound.  Musculoskeletal:        General: No deformity.     Cervical back: Full passive range of motion without pain, normal range of motion and neck supple. No edema, erythema, rigidity or crepitus. No pain with movement, spinous process tenderness or muscular tenderness.     Right lower leg: No edema.     Left lower leg: No edema.  Lymphadenopathy:     Cervical: No cervical adenopathy.  Skin:    General: Skin is warm and dry.     Capillary Refill: Capillary refill takes less than 2 seconds.  Neurological:     General: No focal deficit present.     Mental Status: She is alert and oriented to person, place, and time. Mental status is at baseline.     Cranial Nerves: Cranial nerves are intact.     Sensory: Sensation is intact.     Motor: Motor function is intact.  Psychiatric:        Mood and Affect: Mood normal.     ED Results / Procedures / Treatments   Labs (all labs ordered are listed, but only abnormal results are displayed) Labs Reviewed  CBC WITH DIFFERENTIAL/PLATELET - Abnormal; Notable for the following components:      Result Value   MCV 77.9 (*)    MCH 25.3 (*)    All other  components within normal limits  COMPREHENSIVE METABOLIC PANEL - Abnormal; Notable for the following components:   Sodium 129 (*)    Potassium 3.0 (*)    Chloride 97 (*)    CO2 21 (*)    Calcium 8.6 (*)    Total Bilirubin 1.8 (*)    All other components within normal limits  GROUP A STREP BY PCR  RESP PANEL BY RT-PCR (FLU A&B, COVID) ARPGX2  PREGNANCY, URINE  MONONUCLEOSIS SCREEN    EKG None  Radiology No results  found.  Procedures Procedures   Medications Ordered in ED Medications  ibuprofen (ADVIL) tablet 800 mg (800 mg Oral Given 12/31/20 1432)  dexamethasone (DECADRON) injection 10 mg (10 mg Intramuscular Given 12/31/20 1749)  amoxicillin (AMOXIL) capsule 500 mg (500 mg Oral Given 12/31/20 1749)    ED Course  I have reviewed the triage vital signs and the nursing notes.  Pertinent labs & imaging results that were available during my care of the patient were reviewed by me and considered in my medical decision making (see chart for details).    MDM Rules/Calculators/A&P                         29-year female presents with concern for sore throat and headache as well as fevers for the last 2 days.  Differential diagnosis for this patient symptoms includes but is not limited to strep pharyngitis, gonococcal infection, diphtheria, mononucleosis, laryngitis, acute bronchitis, COVID-19, Ludwick's angina, oropharyngeal abscess peritonsillar abscess, epiglottitis.  Patient febrile on intake to 103 F.  Tachycardic, vital signs otherwise normal.  Time of my exam, cardiopulmonary exam is normal, abdominal exam is benign.  HEENT exam revealed erythematous edematous tonsils with exudate bilaterally without obstruction of the airway.  No sublingual or submental tenderness to palpation to suggest Ludwig's angina.  No sign of oropharyngeal abscess no uvular deviation.  There is mild anterior cervical lymphadenopathy without tracheal deviation or change in phonation.  Basic laboratory  studies were obtained in triage.  CBC is unremarkable, CMP with hyponatremia 129, hypokalemia to 3.0.  COVID-19, influenza, group A strep, and mononucleosis screening tests are all negative.  Given physical exam, will proceed with antibiotic treatment for possible bacterial pharyngitis.  Shared decision making with patient regarding IV fluid resuscitation versus oral rehydration therapy.  Patient voiced understanding for treatment options and expressed to proceed with oral rehydration therapy.  Will administer first dose of antibiotic in the emergency department as well as IM of Decadron.  Will discharge with course of antibiotics and prescription for viscous lidocaine as needed.  Given otherwise reassuring physical exam and normal vital signs at this time, no further work-up is warranted in ED tonight.  Rosalyn Charters voiced understanding for medical evaluation and treatment plan.  Each of her questions was answered to her expressed satisfaction.  Return precautions were given.  Patient is stable and appropriate for discharge at this time.  This chart was dictated using voice recognition software, Dragon. Despite the best efforts of this provider to proofread and correct errors, errors may still occur which can change documentation meaning.  Final Clinical Impression(s) / ED Diagnoses Final diagnoses:  Pharyngitis, unspecified etiology    Rx / DC Orders ED Discharge Orders         Ordered    amoxicillin (AMOXIL) 500 MG capsule  2 times daily        12/31/20 1735    lidocaine (XYLOCAINE) 2 % solution  As needed        12/31/20 1735    potassium chloride SA (KLOR-CON) 20 MEQ tablet  2 times daily        12/31/20 1737           Rondi Ivy, Eugene Gavia, PA-C 12/31/20 1803    Koleen Distance, MD 12/31/20 2213

## 2021-05-08 IMAGING — CR DG CHEST 2V
2 series · 2 of 2 positions shown · non-contrast
Comparison: None.

CLINICAL DATA: Congestion, fever 101, body aches x 4 days. Non
smoker.

EXAM:
CHEST - 2 VIEW

[w chest pa]
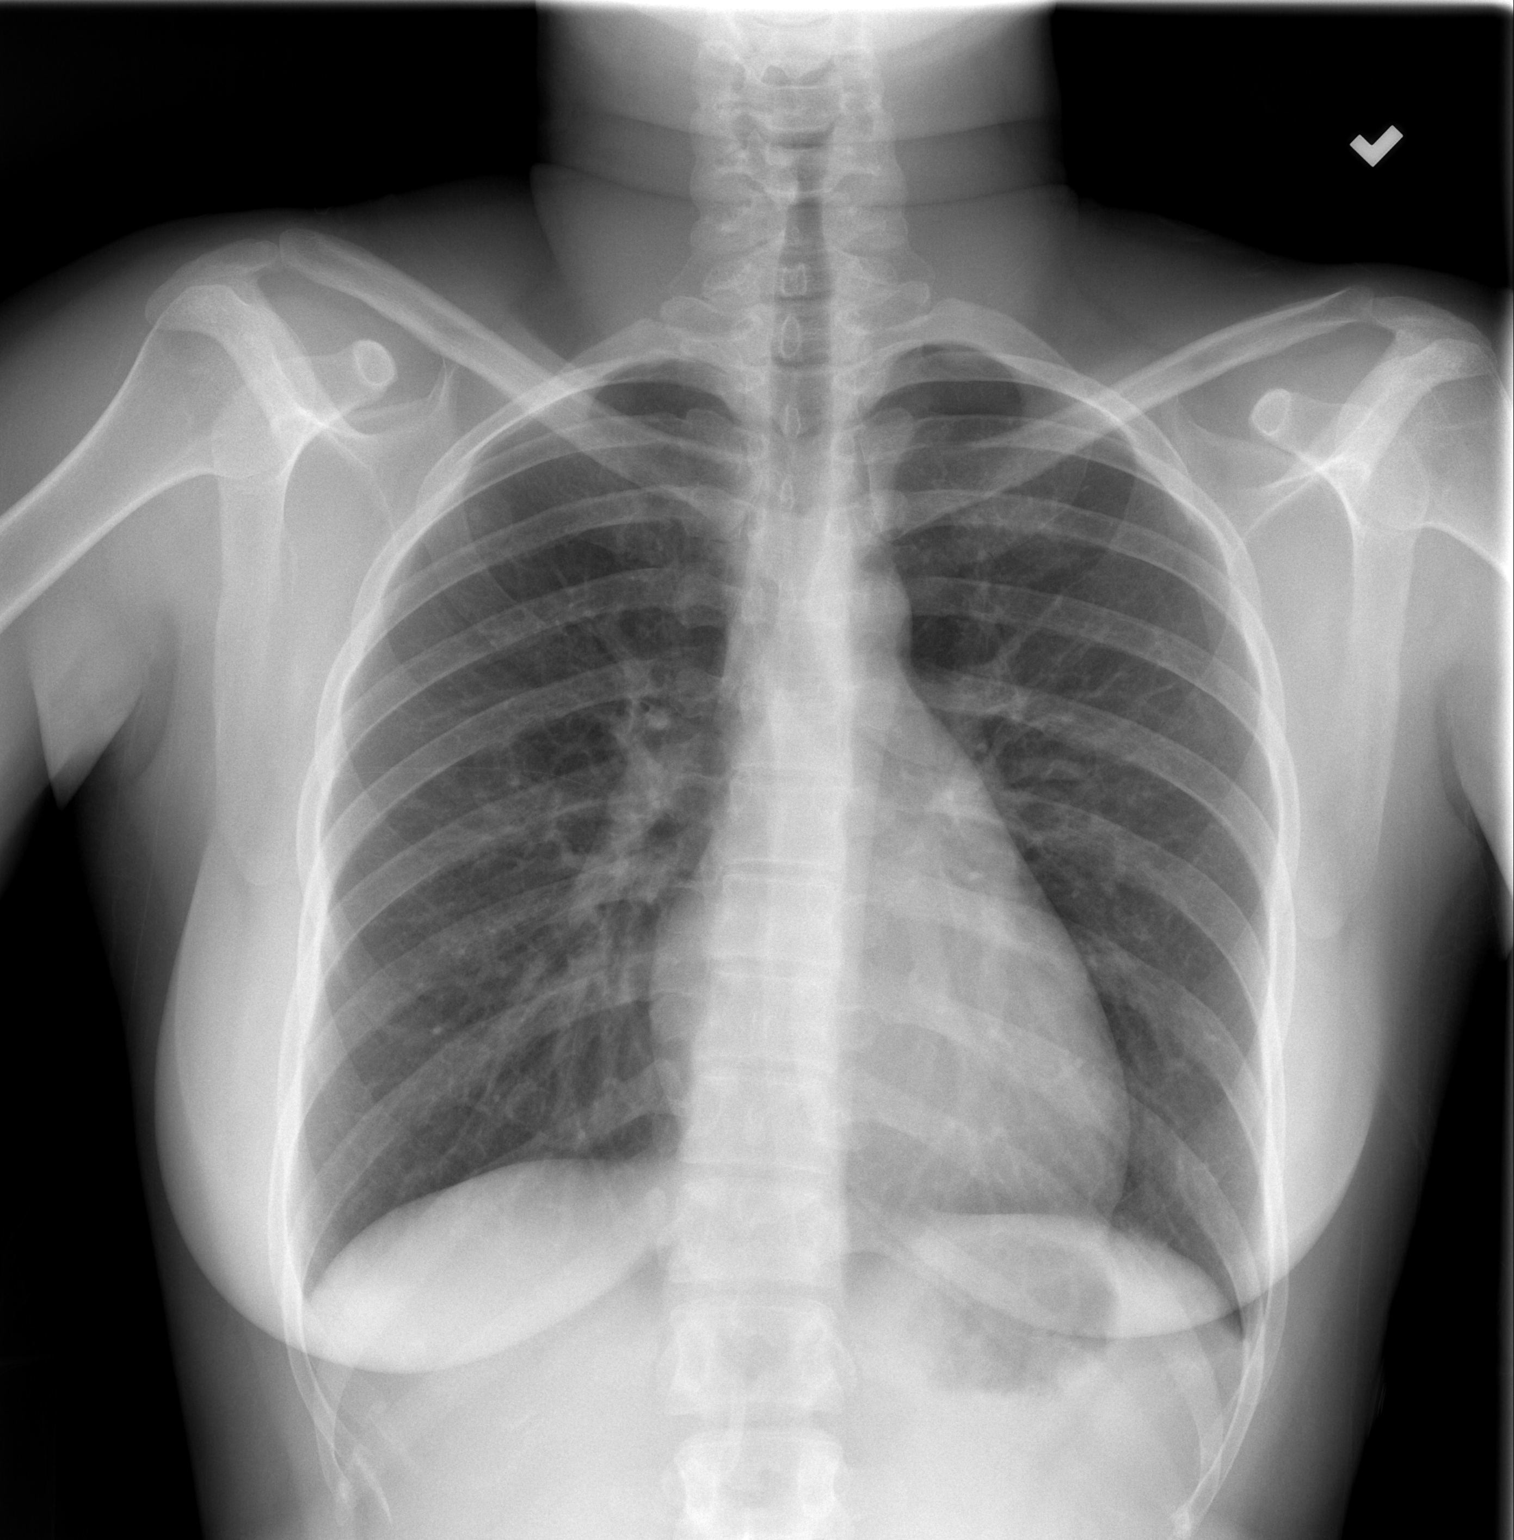

[w chest lat]
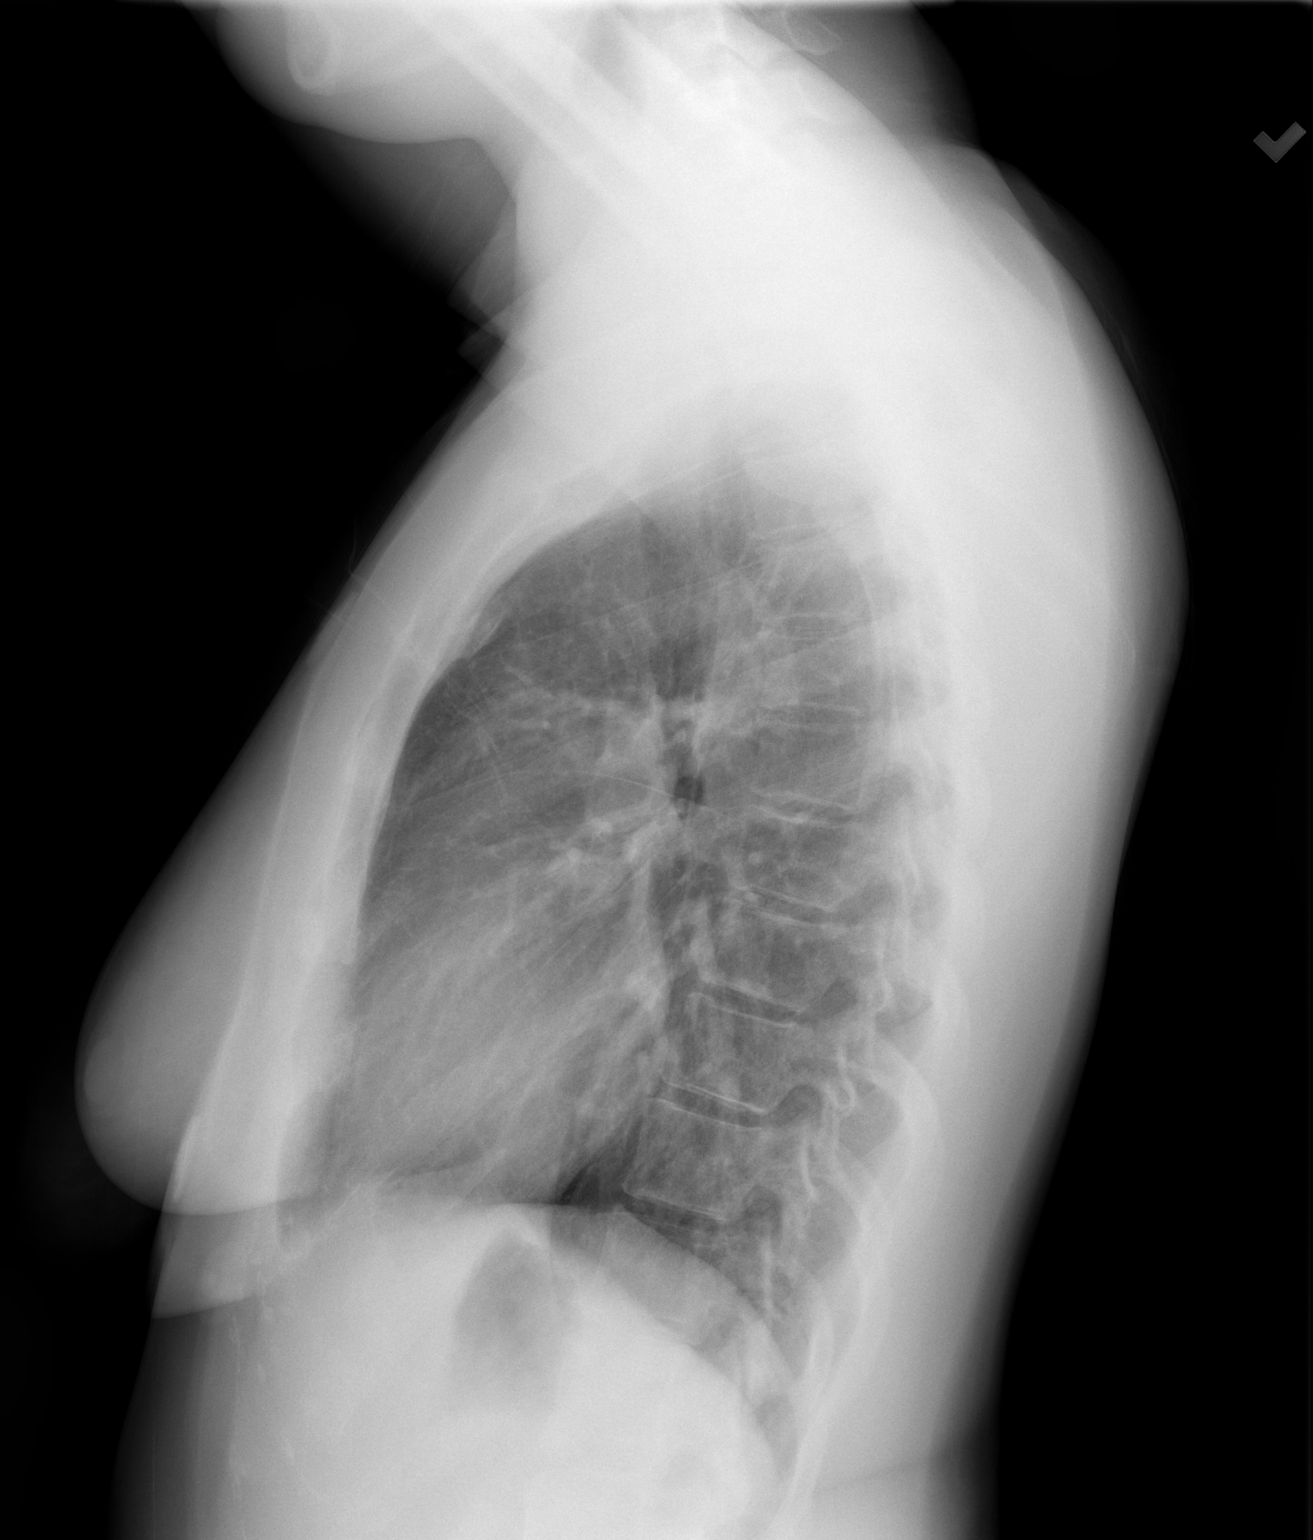

[2 of 2 positions shown; findings below may reference images not displayed]

FINDINGS: The heart size and mediastinal contours are within normal limits.
The lungs are clear. No pneumothorax or pleural effusion. The
visualized skeletal structures are unremarkable.
IMPRESSION: No evidence of active disease in the chest.

## 2022-12-27 ENCOUNTER — Encounter (HOSPITAL_COMMUNITY): Payer: Self-pay

## 2022-12-27 ENCOUNTER — Ambulatory Visit (HOSPITAL_COMMUNITY)
Admission: EM | Admit: 2022-12-27 | Discharge: 2022-12-27 | Disposition: A | Discharge status: Home / Self Care | Payer: 59 | Attending: Internal Medicine | Admitting: Internal Medicine

## 2022-12-27 DIAGNOSIS — J020 Streptococcal pharyngitis: Secondary | ICD-10-CM | POA: Diagnosis not present

## 2022-12-27 LAB — POCT RAPID STREP A, ED / UC: Streptococcus, Group A Screen (Direct): POSITIVE — AB

## 2022-12-27 MED ORDER — AMOXICILLIN 500 MG PO CAPS: 500.0000 mg | ORAL_CAPSULE | Freq: Two times a day (BID) | ORAL | 0 refills | Status: AC

## 2022-12-27 NOTE — ED Triage Notes (Signed)
Patient c/o sore throat, headache, and chills x 3 days.  Patient states she has been taking Tylenol, Aleve, and Ibuprofen.  Patient states she took Ibuprofen liquid 400 mg at 0300 today.

## 2022-12-27 NOTE — Discharge Instructions (Addendum)
Warm salt water gargle Ibuprofen as needed for pain and/or fever Will call you with recommendations if labs are abnormal Strep test is positive Return to urgent care if you have worsening symptoms.

## 2022-12-28 NOTE — ED Provider Notes (Signed)
[facility]    CSN: [id number] Arrival date & time: 12/27/22  [id number]      History   Chief Complaint Chief Complaint  Patient presents with   Sore Throat    Chills - Entered by patient   Chills   Headache    HPI Nancy Cruz is a 32 y.o. female comes to the urgent care with 3-day history of severe sore throat, headache and chills.  Patient's symptoms started fairly abruptly and has been persistent.  She denies any sick contacts.  No fever.  No nausea, vomiting or diarrhea.  Patient has taken ibuprofen and Tylenol with no relief.  HPI  History reviewed. No pertinent past medical history.  There are no problems to display for this patient.   History reviewed. No pertinent surgical history.  OB History   No obstetric history on file.      Home Medications    Prior to Admission medications   Medication Sig Start Date End Date Taking? Authorizing Provider  amoxicillin (AMOXIL) 500 MG capsule Take 1 capsule (500 mg total) by mouth 2 (two) times daily for 10 days. 12/27/22 01/06/23 Yes Maxwell Lemen, Myrene Galas, MD  lidocaine (XYLOCAINE) 2 % solution Use as directed 15 mLs in the mouth or throat as needed for mouth pain. 12/31/20   Sponseller, Eugene Garnet R, PA-C  potassium chloride SA (KLOR-CON) 20 MEQ tablet Take 1 tablet (20 mEq total) by mouth 2 (two) times daily for 3 days. 12/31/20 01/03/21  Sponseller, Gypsy Balsam, PA-C    Family History History reviewed. No pertinent family history.  Social History Social History   Tobacco Use   Smoking status: Never   Smokeless tobacco: Never  Vaping Use   Vaping Use: Never used  Substance Use Topics   Alcohol use: Yes    Comment: occ   Drug use: No     Allergies   Patient has no known allergies.   Review of Systems Review of Systems As per HPI  Physical Exam Triage Vital Signs ED Triage Vitals  Enc Vitals Group     BP 12/27/22 1010 108/82     Pulse Rate 12/27/22 1010 88     Resp 12/27/22 1010 16     Temp 12/27/22  1010 98.2 F (36.8 C)     Temp Source 12/27/22 1010 Oral     SpO2 12/27/22 1010 98 %     Weight --      Height --      Head Circumference --      Peak Flow --      Pain Score 12/27/22 1011 7     Pain Loc --      Pain Edu? --      Excl. in [place]? --    No data found.  Updated Vital Signs BP 108/82 (BP Location: Left Arm)   Pulse 88   Temp 98.2 F (36.8 C) (Oral)   Resp 16   LMP 12/18/2022   SpO2 98%   Visual Acuity Right Eye Distance:   Left Eye Distance:   Bilateral Distance:    Right Eye Near:   Left Eye Near:    Bilateral Near:     Physical Exam Vitals and nursing note reviewed.  Constitutional:      Appearance: She is not ill-appearing.  HENT:     Right Ear: Tympanic membrane normal.     Left Ear: Tympanic membrane normal.     Mouth/Throat:     Mouth: Mucous membranes are moist.  Pharynx: Posterior oropharyngeal erythema present.     Tonsils: Tonsillar exudate present. 1+ on the right. 1+ on the left.  Cardiovascular:     Rate and Rhythm: Normal rate and regular rhythm.  Pulmonary:     Effort: Pulmonary effort is normal.     Breath sounds: Normal breath sounds.  Neurological:     Mental Status: She is alert.      UC Treatments / Results  Labs (all labs ordered are listed, but only abnormal results are displayed) Labs Reviewed  POCT RAPID STREP A, ED / UC - Abnormal; Notable for the following components:      Result Value   Streptococcus, Group A Screen (Direct) POSITIVE (*)    All other components within normal limits    EKG   Radiology No results found.  Procedures Procedures (including critical care time)  Medications Ordered in UC Medications - No data to display  Initial Impression / Assessment and Plan / UC Course  I have reviewed the triage vital signs and the nursing notes.  Pertinent labs & imaging results that were available during my care of the patient were reviewed by me and considered in my medical decision making (see  chart for details).     1.  Streptococcal pharyngitis: Point-of-care strep test is positive Amoxicillin 500 mg twice daily for 10 days Warm salt water gargle Tylenol or ibuprofen as needed for pain and/or fever Return to urgent care if symptoms worsen. Final Clinical Impressions(s) / UC Diagnoses   Final diagnoses:  Strep pharyngitis     Discharge Instructions      Warm salt water gargle Ibuprofen as needed for pain and/or fever Will call you with recommendations if labs are abnormal Strep test is positive Return to urgent care if you have worsening symptoms.   ED Prescriptions     Medication Sig Dispense Auth. Provider   amoxicillin (AMOXIL) 500 MG capsule Take 1 capsule (500 mg total) by mouth 2 (two) times daily for 10 days. 20 capsule Sharalyn Lomba, Myrene Galas, MD      PDMP not reviewed this encounter.   Chase Picket, MD 12/28/22 [phone]
